# Patient Record
Sex: Male | Born: 2000 | Race: White | Hispanic: No | Marital: Single | Smoking: Current every day smoker
Health system: Southern US, Community
[De-identification: ages and names within clinical notes are randomized; demographics above are authoritative.]

---
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
## 2021-04-02 ENCOUNTER — Emergency Department
Admission: EM | Admit: 2021-04-02 | Discharge: 2021-04-02 | Disposition: A | Discharge status: Home / Self Care | Payer: Medicaid Other | Attending: Emergency Medicine | Admitting: Emergency Medicine

## 2021-04-02 ENCOUNTER — Emergency Department: Payer: Medicaid Other

## 2021-04-02 ENCOUNTER — Other Ambulatory Visit: Payer: Self-pay

## 2021-04-02 DIAGNOSIS — R0602 Shortness of breath: Secondary | ICD-10-CM | POA: Diagnosis not present

## 2021-04-02 DIAGNOSIS — R059 Cough, unspecified: Secondary | ICD-10-CM | POA: Insufficient documentation

## 2021-04-02 DIAGNOSIS — X021XXA Exposure to smoke in controlled fire in building or structure, initial encounter: Secondary | ICD-10-CM | POA: Insufficient documentation

## 2021-04-02 DIAGNOSIS — J705 Respiratory conditions due to smoke inhalation: Secondary | ICD-10-CM | POA: Diagnosis not present

## 2021-04-02 DIAGNOSIS — T59811A Toxic effect of smoke, accidental (unintentional), initial encounter: Secondary | ICD-10-CM | POA: Diagnosis not present

## 2021-04-02 DIAGNOSIS — F17290 Nicotine dependence, other tobacco product, uncomplicated: Secondary | ICD-10-CM | POA: Diagnosis not present

## 2021-04-02 DIAGNOSIS — R0902 Hypoxemia: Secondary | ICD-10-CM | POA: Diagnosis not present

## 2021-04-02 DIAGNOSIS — R0689 Other abnormalities of breathing: Secondary | ICD-10-CM | POA: Diagnosis not present

## 2021-04-02 DIAGNOSIS — T59814A Toxic effect of smoke, undetermined, initial encounter: Secondary | ICD-10-CM | POA: Diagnosis not present

## 2021-04-02 LAB — CBC WITH DIFFERENTIAL/PLATELET
Abs Immature Granulocytes: 0.04 10*3/uL (ref 0.00–0.07)
Basophils Absolute: 0 10*3/uL (ref 0.0–0.1)
Basophils Relative: 0 %
Eosinophils Absolute: 0 10*3/uL (ref 0.0–0.5)
Eosinophils Relative: 0 %
HCT: 39.5 % (ref 39.0–52.0)
Hemoglobin: 14.7 g/dL (ref 13.0–17.0)
Immature Granulocytes: 0 %
Lymphocytes Relative: 13 %
Lymphs Abs: 1.4 10*3/uL (ref 0.7–4.0)
MCH: 33.1 pg (ref 26.0–34.0)
MCHC: 37.2 g/dL — ABNORMAL HIGH (ref 30.0–36.0)
MCV: 89 fL (ref 80.0–100.0)
Monocytes Absolute: 0.8 10*3/uL (ref 0.1–1.0)
Monocytes Relative: 8 %
Neutro Abs: 8.1 10*3/uL — ABNORMAL HIGH (ref 1.7–7.7)
Neutrophils Relative %: 79 %
Platelets: 226 10*3/uL (ref 150–400)
RBC: 4.44 MIL/uL (ref 4.22–5.81)
RDW: 12 % (ref 11.5–15.5)
WBC: 10.4 10*3/uL (ref 4.0–10.5)
nRBC: 0 % (ref 0.0–0.2)

## 2021-04-02 LAB — BASIC METABOLIC PANEL
Anion gap: 9 (ref 5–15)
BUN: 15 mg/dL (ref 6–20)
CO2: 26 mmol/L (ref 22–32)
Calcium: 8.6 mg/dL — ABNORMAL LOW (ref 8.9–10.3)
Chloride: 102 mmol/L (ref 98–111)
Creatinine, Ser: 1.01 mg/dL (ref 0.61–1.24)
GFR, Estimated: >60 mL/min (ref >60)
Glucose, Bld: 99 mg/dL (ref 70–99)
Potassium: 3.7 mmol/L (ref 3.5–5.1)
Sodium: 137 mmol/L (ref 135–145)

## 2021-04-02 LAB — COOXEMETRY PANEL
Carboxyhemoglobin: 2.9 % — ABNORMAL HIGH (ref 0.5–1.5)
Methemoglobin: 0.9 % (ref 0.0–1.5)
O2 Saturation: 86.9 %
Total hemoglobin: 14.7 g/dL (ref 12.0–16.0)

## 2021-04-02 MED ORDER — IPRATROPIUM-ALBUTEROL 0.5-2.5 (3) MG/3ML IN SOLN
3.0000 mL | Freq: Once | RESPIRATORY_TRACT | Status: AC
  Administered 2021-04-02: 3 mL via RESPIRATORY_TRACT
  Filled 2021-04-02: qty 3
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
## 2021-04-02 NOTE — ED Triage Notes (Signed)
Discussed pt with Dr Erma Heritage, pt made acuity level 2 and roomed per Dr Erma Heritage
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
## 2021-04-02 NOTE — Discharge Instructions (Addendum)
Return to the Er if you develop:  Difficulty swallowing  Noise/whistling when breathing  Wheezing  Cough or shortness of breath

## 2021-04-02 NOTE — ED Notes (Signed)
Report received from Heather, RN.

## 2021-04-03 ENCOUNTER — Telehealth: Payer: Self-pay

## 2021-04-03 DIAGNOSIS — Z9189 Other specified personal risk factors, not elsewhere classified: Secondary | ICD-10-CM

## 2021-04-03 NOTE — Telephone Encounter (Signed)
Transition Care Management Unsuccessful Follow-up Telephone Call  Date of discharge and from where:  04/02/2021 from [facility]  Attempts:  1st Attempt  Reason for unsuccessful TCM follow-up call:  Unable to leave message

## 2021-04-04 NOTE — Telephone Encounter (Signed)
Transition Care Management Follow-up Telephone Call Date of discharge and from where: 04/02/2021 from [facility] How have you been since you were released from the hospital? Pt stated that he is feeling much better. Pt stated that his chest does not feel heavy anymore and he is able to take deep breathes. Pt stated that he was interested in establishing care with a PCP. Pt has been informed that his insurance has him listed as a [facility] patient. Pt agreed with referral to assist with [facility] placement.  Any questions or concerns? No  Items Reviewed: Did the pt receive and understand the discharge instructions provided? Yes  Medications obtained and verified? Yes  Other? No  Any new allergies since your discharge? No  Dietary orders reviewed? No Do you have support at home? Yes   Functional Questionnaire: (I = Independent and D = Dependent) ADLs: I  Bathing/Dressing- I  Meal Prep- I  Eating- I  Maintaining continence- I  Transferring/Ambulation- I  Managing Meds- I   Follow up appointments reviewed:  PCP Hospital f/u appt confirmed? No   Specialist Hospital f/u appt confirmed? No   Are transportation arrangements needed? No  If their condition worsens, is the pt aware to call PCP or go to the Emergency Dept.? Yes Was the patient provided with contact information for the PCP's office or ED? Yes Was to pt encouraged to call back with questions or concerns? Yes

## 2021-04-10 ENCOUNTER — Other Ambulatory Visit: Payer: Self-pay

## 2021-04-10 NOTE — Patient Instructions (Signed)
Visit Information  Joel Logan was given information about Medicaid Managed Care team care coordination services as a part of their [facility] Community Plan Medicaid benefit. Joel Logan verbally consented to engagement with the [facility] Managed Care team.   If you are experiencing a medical emergency, please call 911 or report to your local emergency department or urgent care.   If you have a non-emergency medical problem during routine business hours, please contact your provider's office and ask to speak with a nurse.   For questions related to your [facility], please call: [phone] or visit the homepage here: [email or web address]  If you would like to schedule transportation through your [facility], please call the following number at least 2 days in advance of your appointment: [phone].   Call the Behavioral Health Crisis Line at [phone], at any time, 24 hours a day, 7 days a week. If you are in danger or need immediate medical attention call 911.  If you would like help to quit smoking, call 1-800-QUIT-NOW (419-138-2985) OR Espaol: 1-855-Djelo-Ya (0-867-619-5093) o para ms informacin haga clic aqu or Text READY to 267-124 to register via text  Joel Logan - following are the goals we discussed in your visit today:   Goals Addressed   None      The  Patient                                              has been provided with contact information for the Managed Medicaid care management team and has been advised to call with any health related questions or concerns.   Joel Logan, BSW, [facility]  [place]  High Risk Managed Medicaid Team  [phone]   Following is a copy of your plan of care:  There are no care plans to display for this patient.

## 2021-04-10 NOTE — Patient Outreach (Signed)
  Medicaid Managed Care Social Work Note  04/10/2021 Name:  Joel Logan MRN:  [id number] DOB:  30-Sep-2000  Joel Logan is an 20 y.o. year old male who is a primary patient of Patient, No Pcp Per (Inactive).  The [facility] Managed Care Coordination team was consulted for assistance with:   PCP  Mr. Sime was given information about Medicaid Managed Care Coordination team services today. Joel Logan Patient agreed to services and verbal consent obtained.  Engaged with patient  for by telephone forinitial visit in response to referral for case management and/or care coordination services.   Assessments/Interventions:  Review of past medical history, allergies, medications, health status, including review of consultants reports, laboratory and other test data, was performed as part of comprehensive evaluation and provision of chronic care management services.  SDOH: (Social Determinant of Health) assessments and interventions performed: BSW researched medical offices in [place] that are accepting new patients and accepts MM. BSW contacted [facility] and they are accepting new patients as well as [facility]. BSW provided patient with both telephone numbers to call and schedule a new patient appointment. Patient stated no other resources.services are needed at this time.  Advanced Directives Status:  Not addressed in this encounter.  Care Plan                 No Known Allergies  Medications Reviewed Today   Medications were not reviewed prior to this encounter     There are no problems to display for this patient.   Conditions to be addressed/monitored per PCP order:   PCP  There are no care plans that you recently modified to display for this patient.   Follow up:  Patient agrees to Care Plan and Follow-up.  Plan: The  Patient has been provided with contact information for the Managed Medicaid care management team and has been advised to  call with any health related questions or concerns.    Gus Puma, BSW, [facility]  [employer] Risk Managed Medicaid Team  [phone]

## 2021-04-30 ENCOUNTER — Ambulatory Visit (INDEPENDENT_AMBULATORY_CARE_PROVIDER_SITE_OTHER): Payer: Medicaid Other | Admitting: Internal Medicine

## 2021-04-30 ENCOUNTER — Other Ambulatory Visit (HOSPITAL_COMMUNITY)
Admission: RE | Admit: 2021-04-30 | Discharge: 2021-04-30 | Disposition: A | Discharge status: Home / Self Care | Payer: Medicaid Other | Source: Ambulatory Visit | Attending: Internal Medicine | Admitting: Internal Medicine

## 2021-04-30 ENCOUNTER — Other Ambulatory Visit: Payer: Self-pay

## 2021-04-30 ENCOUNTER — Encounter: Payer: Self-pay | Admitting: Internal Medicine

## 2021-04-30 VITALS — BP 114/48 | HR 61 | Temp 97.1°F | Resp 18 | Ht 68.01 in | Wt 151.6 lb

## 2021-04-30 DIAGNOSIS — R300 Dysuria: Secondary | ICD-10-CM | POA: Diagnosis not present

## 2021-04-30 DIAGNOSIS — R1031 Right lower quadrant pain: Secondary | ICD-10-CM

## 2021-04-30 DIAGNOSIS — N50811 Right testicular pain: Secondary | ICD-10-CM

## 2021-04-30 DIAGNOSIS — N50812 Left testicular pain: Secondary | ICD-10-CM | POA: Diagnosis not present

## 2021-04-30 DIAGNOSIS — R350 Frequency of micturition: Secondary | ICD-10-CM | POA: Diagnosis not present

## 2021-04-30 LAB — POCT URINALYSIS DIPSTICK
Bilirubin, UA: NEGATIVE
Blood, UA: NEGATIVE
Glucose, UA: NEGATIVE
Ketones, UA: NEGATIVE
Leukocytes, UA: NEGATIVE
Nitrite, UA: NEGATIVE
Protein, UA: NEGATIVE
Spec Grav, UA: <=1.005 — AB (ref 1.010–1.025)
Urobilinogen, UA: 0.2 E.U./dL
pH, UA: 7 (ref 5.0–8.0)

## 2021-04-30 NOTE — Progress Notes (Signed)
HPI  Pt presents to the clinic today to establish care.  He report RLQ pain. This started 1 week ago. He describes the pain as sharp and achy. He reports some frequency, dysuria and testicular pain. He denies nausea, vomiting, diarrhea, constipation or blood in his stool. He denies urinary urgency, blood in his urine, penile discharge, penile lesion or testicular swelling. He denies fever, chills or body aches. He has not tried anything OTC for these symptoms. He is sexually active. He also does a lot of heavy lifting at work.  No past medical history on file.  No current outpatient medications on file.   No current facility-administered medications for this visit.    No Known Allergies  No family history on file.  Social History   Socioeconomic History   Marital status: Single    Spouse name: Not on file   Number of children: Not on file   Years of education: Not on file   Highest education level: Not on file  Occupational History   Not on file  Tobacco Use   Smoking status: Every Day    Types: E-cigarettes   Smokeless tobacco: Never  Vaping Use   Vaping Use: Every day   Substances: Nicotine, Flavoring  Substance and Sexual Activity   Alcohol use: Not Currently   Drug use: Not Currently    Types: Marijuana   Sexual activity: Yes  Other Topics Concern   Not on file  Social History Narrative   Not on file   Social Determinants of Health   Financial Resource Strain: Not on file  Food Insecurity: Not on file  Transportation Needs: Not on file  Physical Activity: Not on file  Stress: Not on file  Social Connections: Not on file  Intimate Partner Violence: Not on file    ROS:  Constitutional: Denies fever, malaise, fatigue, headache or abrupt weight changes.  HEENT: Denies eye pain, eye redness, ear pain, ringing in the ears, wax buildup, runny nose, nasal congestion, bloody nose, or sore throat. Respiratory: Denies difficulty breathing, shortness of breath,  cough or sputum production.   Cardiovascular: Denies chest pain, chest tightness, palpitations or swelling in the hands or feet.  Gastrointestinal: Pt reports RLQ abdominal pain. Denies bloating, constipation, diarrhea or blood in the stool.  GU: Pt reports frequency, dysuria and testicular pain. Denies urgency, blood in urine, odor or discharge. Musculoskeletal: Denies decrease in range of motion, difficulty with gait, muscle pain or joint pain and swelling.  Skin: Denies redness, rashes, lesions or ulcercations.  Neurological: Denies dizziness, difficulty with memory, difficulty with speech or problems with balance and coordination.  Psych: Denies anxiety, depression, SI/HI.  No other specific complaints in a complete review of systems (except as listed in HPI above).  PE:  BP (!) 114/48 (BP Location: Right Arm, Patient Position: Sitting, Cuff Size: Normal)   Pulse 61   Temp (!) 97.1 F (36.2 C) (Temporal)   Resp 18   Ht 5' 8.01" (1.727 m)   Wt 151 lb 9.6 oz (68.8 kg)   SpO2 100%   BMI 23.04 kg/m  Wt Readings from Last 3 Encounters:  04/30/21 151 lb 9.6 oz (68.8 kg) (45 %, Z= -0.12)*  04/02/21 150 lb (68 kg) (43 %, Z= -0.18)*  07/02/20 160 lb (72.6 kg) (63 %, Z= 0.32)*   * Growth percentiles are based on CDC (Boys, 2-20 Years) data.    General: Appears his stated age, well developed, well nourished in NAD. HEENT: Head: normal shape  and size; Eyes: EOMs intact;  Cardiovascular: Normal rate and rhythm. S1,S2 noted.  No murmur, rubs or gallops noted.  Pulmonary/Chest: Normal effort and positive vesicular breath sounds. No respiratory distress. No wheezes, rales or ronchi noted.  Abdomen: Soft and tender in the RLQ at McBurney's point. + rebound tenderness.  Normal bowel sounds No distention or masses noted.  GU: Normal male anatomy. Fullness noted in the right inguinal area. No obvious hernia bilaterally. Testicles without lesion or swelling.  Musculoskeletal: No difficulty with  gait.  Neurological: Alert and oriented.   BMET    Component Value Date/Time   NA 137 04/02/2021 1814   K 3.7 04/02/2021 1814   CL 102 04/02/2021 1814   CO2 26 04/02/2021 1814   GLUCOSE 99 04/02/2021 1814   BUN 15 04/02/2021 1814   CREATININE 1.01 04/02/2021 1814   CALCIUM 8.6 (L) 04/02/2021 1814   GFRNONAA >60 04/02/2021 1814    Lipid Panel  No results found for: CHOL, TRIG, HDL, CHOLHDL, VLDL, LDLCALC  CBC    Component Value Date/Time   WBC 10.4 04/02/2021 1814   RBC 4.44 04/02/2021 1814   HGB 14.7 04/02/2021 1814   HCT 39.5 04/02/2021 1814   PLT 226 04/02/2021 1814   MCV 89.0 04/02/2021 1814   MCH 33.1 04/02/2021 1814   MCHC 37.2 (H) 04/02/2021 1814   RDW 12.0 04/02/2021 1814   LYMPHSABS 1.4 04/02/2021 1814   MONOABS 0.8 04/02/2021 1814   EOSABS 0.0 04/02/2021 1814   BASOSABS 0.0 04/02/2021 1814    Hgb A1C No results found for: HGBA1C   Assessment and Plan:  RLQ Pain, Urinary Frequency, Dysuria, Testicular Pain:  DDx include UTI, prostatitis, STI, hernia, appendicitis Urinalysis Will check urine for gonorrhea, chlamydia and trich Consider [place] groin to r/o hernia pending labs Push fluids Avoid heaving lifting Likely not appendicitis given no fever, GI symptoms  Will follow up after labs with further recommendations and treatment plan Nicki Reaper, NP This visit occurred during the SARS-CoV-2 public health emergency.  Safety protocols were in place, including screening questions prior to the visit, additional usage of staff PPE, and extensive cleaning of exam room while observing appropriate contact time as indicated for disinfecting solutions.

## 2021-04-30 NOTE — Addendum Note (Signed)
Addended by: Lonna Cobb on: 04/30/2021 02:30 PM   Modules accepted: Orders

## 2021-04-30 NOTE — Patient Instructions (Signed)
Safe Sex Practicing safe sex means taking steps before and during sex to reduce your risk of: Getting an STI (sexually transmitted infection). Giving your partner an STI. Unwanted or unplanned pregnancy. How to practice safe sex Ways you can practice safe sex  Limit your sexual partners to only one partner who is having sex with only you. Avoid using alcohol and drugs before having sex. Alcohol and drugs can affect your judgment. Before having sex with a new partner: Talk to your partner about past partners, past STIs, and drug use. Get screened for STIs and discuss the results with your partner. Ask your partner to get screened too. Check your body regularly for sores, blisters, rashes, or unusual discharge. If you notice any of these problems, visit your health care provider. Avoid sexual contact if you have symptoms of an infection or you are being treated for an STI. While having sex, use a condom. Make sure to: Use a condom every time you have vaginal, oral, or anal sex. Both females and males should wear condoms during oral sex. Keep condoms in place from the beginning to the end of sexual activity. Use a latex condom, if possible. Latex condoms offer the best protection. Use only water-based lubricants with a condom. Using petroleum-based lubricants or oils will weaken the condom and increase the chance that it will break. Ways your health care provider can help you practice safe sex  See your health care provider for regular screenings, exams, and tests for STIs. Talk with your health care provider about what kind of birth control (contraception) is best for you. Get vaccinated against hepatitis B and human papillomavirus (HPV). If you are at risk of being infected with HIV (human immunodeficiency virus), talk with your health care provider about taking a prescription medicine to prevent HIV infection. You are at risk for HIV if you: Are a man who has sex with other men. Are  sexually active with more than one partner. Take drugs by injection. Have a sex partner who has HIV. Have unprotected sex. Have sex with someone who has sex with both men and women. Have had an STI. Follow these instructions at home: Take over-the-counter and prescription medicines only as told by your health care provider. Keep all follow-up visits. This is important. Where to find more information Centers for Disease Control and Prevention: www.cdc.gov Planned Parenthood: www.plannedparenthood.org Office on Women's Health: www.womenshealth.gov Summary Practicing safe sex means taking steps before and during sex to reduce your risk getting an STI, giving your partner an STI, and having an unwanted or unplanned pregnancy. Before having sex with a new partner, talk to your partner about past partners, past STIs, and drug use. Use a condom every time you have vaginal, oral, or anal sex. Both females and males should wear condoms during oral sex. Check your body regularly for sores, blisters, rashes, or unusual discharge. If you notice any of these problems, visit your health care provider. See your health care provider for regular screenings, exams, and tests for STIs. This information is not intended to replace advice given to you by your health care provider. Make sure you discuss any questions you have with your health care provider. Document Revised: 11/14/2019 Document Reviewed: 11/14/2019 Elsevier Patient Education  2022 Elsevier Inc.  

## 2021-04-30 NOTE — Addendum Note (Signed)
Addended by: Lorre Munroe on: 04/30/2021 02:27 PM   Modules accepted: Orders

## 2021-05-02 LAB — URINE CYTOLOGY ANCILLARY ONLY
Chlamydia: NEGATIVE
Comment: NEGATIVE
Comment: NEGATIVE
Comment: NORMAL
Neisseria Gonorrhea: NEGATIVE
Trichomonas: NEGATIVE
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
## 2023-03-10 ENCOUNTER — Ambulatory Visit (INDEPENDENT_AMBULATORY_CARE_PROVIDER_SITE_OTHER): Payer: Medicaid Other | Admitting: Physician Assistant

## 2023-03-10 ENCOUNTER — Ambulatory Visit: Payer: Self-pay | Admitting: *Deleted

## 2023-03-10 ENCOUNTER — Encounter: Payer: Self-pay | Admitting: Physician Assistant

## 2023-03-10 VITALS — BP 139/84 | HR 90 | Wt 151.8 lb

## 2023-03-10 DIAGNOSIS — R300 Dysuria: Secondary | ICD-10-CM | POA: Diagnosis not present

## 2023-03-10 DIAGNOSIS — A749 Chlamydial infection, unspecified: Secondary | ICD-10-CM | POA: Diagnosis not present

## 2023-03-10 DIAGNOSIS — Z113 Encounter for screening for infections with a predominantly sexual mode of transmission: Secondary | ICD-10-CM

## 2023-03-10 LAB — URINALYSIS, ROUTINE W REFLEX MICROSCOPIC
Bilirubin, UA: NEGATIVE
Glucose, UA: NEGATIVE
Ketones, UA: NEGATIVE
Leukocytes,UA: NEGATIVE
Nitrite, UA: NEGATIVE
Protein,UA: NEGATIVE
RBC, UA: NEGATIVE
Specific Gravity, UA: 1.025 (ref 1.005–1.030)
Urobilinogen, Ur: 0.2 mg/dL (ref 0.2–1.0)
pH, UA: 7 (ref 5.0–7.5)
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
## 2023-03-10 NOTE — Telephone Encounter (Signed)
Summary: burning when urinating   Patient called and stated he is having burning when urinating and would like to be STD tested. No appt's available with Nicki Reaper NP or Dr Kirtland Bouchard. No sharpe pains  Last OV: 04/30/2021  Patients callback # [phone]     Reason for Disposition  All other males with painful urination  Answer Assessment - Initial Assessment Questions 1. SEVERITY: "How bad is the pain?"  (e.g., Scale 1-10; mild, moderate, or severe)   - MILD (1-3): Complains slightly about urination hurting.   - MODERATE (4-7): Interferes with normal activities.     - SEVERE (8-10): Excruciating, unwilling or unable to urinate because of the pain.      Moderate/mild 2. FREQUENCY: "How many times have you had painful urination today?"      Not every time- after urination discomfort 3. PATTERN: "Is pain present every time you urinate or just sometimes?"      Not every time 4. ONSET: "When did the painful urination start?"      1 month ago 5. FEVER: "Do you have a fever?" If Yes, ask: "What is your temperature, how was it measured, and when did it start?"     no 6. PAST UTI: "Have you had a urine infection before?" If Yes, ask: "When was the last time?" and "What happened that time?"      no 7. CAUSE: "What do you think is causing the painful urination?"      Not sure 8. OTHER SYMPTOMS: "Do you have any other symptoms?" (e.g., flank pain, penis discharge, scrotal pain, blood in urine)     no  Protocols used: Urination Pain - Male-A-AH

## 2023-03-10 NOTE — Telephone Encounter (Signed)
  Chief Complaint: urinary pain Symptoms: on/off pain with urination Frequency: at least 1 month Pertinent Negatives: Patient denies flank pain, penis discharge, scrotal pain, blood in urine Disposition: [] ED /[] Urgent Care (no appt availability in office) / [x] Appointment(In office/virtual)/ []  [facility]/ [] Home Care/ [] Refused Recommended Disposition /[] [place] [facility]/ []  Follow-up with PCP Additional Notes:  Patient has been having pain with urination for 1 month-  no open appointment with PCP- has been scheduled with float provider.Advised to make appointment with PCP- it has been 2 years

## 2023-03-11 NOTE — Progress Notes (Signed)
Your urine did not show signs of infection or blood  Your HIV testing was negative We are still waiting on the results of your gonorrhea, chlamydia and syphilis testing and will keep you updated once they come back. For now make sure you are staying well hydrated and avoid holding your urine for prolonged periods of time.

## 2023-03-12 LAB — TREPONEMAL ANTIBODIES, TPPA: Treponemal Antibodies, TPPA: NONREACTIVE

## 2023-03-12 LAB — HIV ANTIBODY (ROUTINE TESTING W REFLEX): HIV Screen 4th Generation wRfx: NONREACTIVE

## 2023-03-12 LAB — RPR W/REFLEX TO TREPSURE: RPR: NONREACTIVE

## 2023-03-13 LAB — GC/CHLAMYDIA PROBE AMP
Chlamydia trachomatis, NAA: POSITIVE — AB
Neisseria Gonorrhoeae by PCR: NEGATIVE

## 2023-03-13 MED ORDER — DOXYCYCLINE HYCLATE 100 MG PO TABS: 100.0000 mg | ORAL_TABLET | Freq: Two times a day (BID) | ORAL | 0 refills | Status: AC

## 2023-03-13 NOTE — Addendum Note (Signed)
Addended by: Jacquelin Hawking on: 03/13/2023 11:48 AM   Modules accepted: Orders

## 2023-03-13 NOTE — Progress Notes (Signed)
Joel Logan- please inform the health department of patient's positive result for chlamydia  To patient: Your testing was positive for Chlamydia which will require an antibiotic to manage. I have sent this in for you. Please finish the entire course even if you are feeling better unless you develop an allergic reaction. If this occurs please give the office a call. If you have further symptoms after finishing the antibiotic please make an apt to make sure the infection has cleared up.  Your syphilis testing was negative.

## 2023-10-13 ENCOUNTER — Ambulatory Visit: Payer: Self-pay

## 2023-10-13 NOTE — Telephone Encounter (Signed)
  Chief Complaint: dysuria Symptoms: clear mucus discharge from penis, burning with urination Frequency: x 1 month Pertinent Negatives: Patient denies fever, blood in urine, abdominal pain, lower back pain. Disposition: [] ED /[] Urgent Care (no appt availability in office) / [x] Appointment(In office/virtual)/ []  [facility]/ [] Home Care/ [] Refused Recommended Disposition /[] [place] [facility]/ []  Follow-up with PCP Additional Notes: Patient was seen 02/2023 and tested positive for Chlamydia, he states this feels similar. Patient agreeable to acute visit tomorrow. Offered appointments with PCP but he states he does not get off work until 1pm. Patient scheduled with Dr Romeo Co.  Copied from CRM [id number]. Topic: Clinical - Red Word Triage >> Oct 13, 2023 10:18 AM Rennis Case wrote: Red Word that prompted transfer to Nurse Triage: burning while urinating, requesting STD Testing Reason for Disposition  All other males with painful urination  Answer Assessment - Initial Assessment Questions 1. SEVERITY: "How bad is the pain?"  (e.g., Scale 1-10; mild, moderate, or severe)   - MILD (1-3): Complains slightly about urination hurting.   - MODERATE (4-7): Interferes with normal activities.     - SEVERE (8-10): Excruciating, unwilling or unable to urinate because of the pain.      Mild.  2. FREQUENCY: "How many times have you had painful urination today?"      He states he just woke up and has not urinated yet.  3. PATTERN: "Is pain present every time you urinate or just sometimes?"      First thing in the morning, after drinking more water it doesn't hurt.  4. ONSET: "When did the painful urination start?"      X 1 month.  5. FEVER: "Do you have a fever?" If Yes, ask: "What is your temperature, how was it measured, and when did it start?"     Denies.  6. PAST UTI: "Have you had a urine infection before?" If Yes, ask: "When was the last time?" and "What happened that time?"       Denies.  7. CAUSE: "What do you think is causing the painful urination?"      He states this feels like the chlamydia he had in the past.  8. OTHER SYMPTOMS: "Do you have any other symptoms?" (e.g., flank pain, penis discharge, scrotal pain, blood in urine)     Penis discharge (clear mucus discharge after urinating).  Protocols used: Urination Pain - Male-A-AH

## 2023-10-13 NOTE — Telephone Encounter (Signed)
 Please notify patient that for STD testing in this circumstance we recommend Urine testing for gonorrhea / chlamydia and likely blood test as well.   For the urine, the recommended is to have 1st or 2nd urination of the day. Could you find out if he can possibly come by early AM for a urine test before his appointment? We will not have results back for several days, but we will still treat him for this condition at his appointment in the afternoon.  Let me know if questions or orders if he can do this.  Domingo Friend, DO [facility] 10/13/2023, 10:45 AM

## 2023-10-13 NOTE — Telephone Encounter (Signed)
 Patient advised per Dr. Linnell Richardson. Patient is fine with plan for appointment tomorrow.

## 2023-10-13 NOTE — Telephone Encounter (Signed)
 Understood.  He can keep apt 4/23 320pm with me. I will treat him at that time with antibiotic course regardless of the testing. He can either do urine and lab testing at his apt 320pm, or he can possibly come in Thurs 4/24 early AM for urine testing. If he cannot do the early testing on 4/24, then we will just test him after his apt on 4/23. It is no problem.  Thanks  Domingo Friend, DO [facility] 10/13/2023, 11:22 AM

## 2023-10-14 ENCOUNTER — Encounter: Payer: Self-pay | Admitting: Family Medicine

## 2023-10-14 ENCOUNTER — Other Ambulatory Visit (HOSPITAL_COMMUNITY)
Admission: RE | Admit: 2023-10-14 | Discharge: 2023-10-14 | Disposition: A | Discharge status: Home / Self Care | Source: Ambulatory Visit | Attending: Family Medicine | Admitting: Family Medicine

## 2023-10-14 ENCOUNTER — Ambulatory Visit (INDEPENDENT_AMBULATORY_CARE_PROVIDER_SITE_OTHER): Admitting: Family Medicine

## 2023-10-14 VITALS — BP 112/78 | HR 77 | Ht 68.0 in | Wt 158.2 lb

## 2023-10-14 DIAGNOSIS — Z8619 Personal history of other infectious and parasitic diseases: Secondary | ICD-10-CM | POA: Insufficient documentation

## 2023-10-14 DIAGNOSIS — N342 Other urethritis: Secondary | ICD-10-CM

## 2023-10-14 DIAGNOSIS — Z202 Contact with and (suspected) exposure to infections with a predominantly sexual mode of transmission: Secondary | ICD-10-CM

## 2023-10-14 MED ORDER — DOXYCYCLINE HYCLATE 100 MG PO TABS: 100.0000 mg | ORAL_TABLET | Freq: Two times a day (BID) | ORAL | 0 refills | Status: AC

## 2023-10-14 NOTE — Patient Instructions (Addendum)
 Thank you for coming to the office today.  Urine and blood testing today.  We will cover you for Chlamydia   Start taking Doxycycline  antibiotic 100mg  twice daily for 7 days. Take with full glass of water and stay upright for at least 30 min after taking, may be seated or standing, but should NOT lay down. This is just a safety precaution, if this medicine does not go all the way down throat well it could cause some burning discomfort to throat and esophagus. Also NOTE - do not take medicine within 2 hours (before or after) consuming dairy or foods / vitamins containing high calcium or iron.  Labs testing for HIV Syphilis, Gonorrhea, Chlamydia. And Urine infection  If any issues with antibiotic let us  know  If not 100% resolved after the 7-10 days, then contact us  back and follow up.  If you want to re-test in the future, follow-up when ready.  Please schedule a Follow-up Appointment to: Return if symptoms worsen or fail to improve.  If you have any other questions or concerns, please feel free to call the office or send a message through MyChart. You may also schedule an earlier appointment if necessary.  Additionally, you may be receiving a survey about your experience at our office within a few days to 1 week by e-mail or mail. We value your feedback.  Domingo Friend, DO [facility], [place]

## 2023-10-14 NOTE — Progress Notes (Signed)
 Subjective:    Patient ID: Joel Logan, male    DOB: 2000/08/18, 23 y.o.   MRN: [id number]  Joel Logan is a 23 y.o. male presenting on 10/14/2023 for Dysuria (Over a month, discharge )  Patient presents for a same day appointment.  PCP Helayne Lo, FNP  HPI  Discussed the use of AI scribe software for clinical note transcription with the patient, who gave verbal consent to proceed.  History of Present Illness   Joel Logan is a 23 year old male who presents with burning and mucus discharge, concerned for recurrent chlamydia infection.  He has been experiencing burning and mucus discharge for about a month. He previously had concerns for a sexually transmitted disease, specifically chlamydia, around September of the previous year. He suspects the current symptoms may be a recurrence from a past infection. Symptoms very similar. Last time treated successfully with Doxycycline .  He had a girlfriend during the time of the initial infection, but he has since split up about a month ago. He has not had any new sexual partners since then. He believes the infection may have been passed back and forth between him and his previous partner.  No systemic symptoms such as fevers or chills, noting that the symptoms are localized. The burning sensation occurs every time he urinates, but it is less severe when he is well-hydrated. No increased frequency of urination or urgency.  He was previously treated with doxycycline , 100 mg twice a day for seven days, without any adverse effects such as nausea. The symptoms cleared up completely after the last treatment.  No allergies to antibiotics and no history of gonorrhea, only chlamydia. His previous partner was also treated for chlamydia after his diagnosis.         10/14/2023    3:44 PM 04/30/2021    2:25 PM  Depression screen PHQ 2/9  Decreased Interest 0 0  Down, Depressed, Hopeless 0 0  PHQ - 2 Score 0 0  Altered sleeping 0    Tired, decreased energy 0   Change in appetite 0   Feeling bad or failure about yourself  0   Trouble concentrating 0   Moving slowly or fidgety/restless 0   Suicidal thoughts 0   PHQ-9 Score 0   Difficult doing work/chores Not difficult at all        10/14/2023    3:44 PM  GAD 7 : Generalized Anxiety Score  Nervous, Anxious, on Edge 0  Control/stop worrying 0  Worry too much - different things 0  Trouble relaxing 0  Restless 0  Easily annoyed or irritable 0  Afraid - awful might happen 0  Total GAD 7 Score 0  Anxiety Difficulty Not difficult at all    Social History   Tobacco Use   Smoking status: Every Day    Types: E-cigarettes   Smokeless tobacco: Never  Vaping Use   Vaping status: Every Day   Substances: Nicotine, Flavoring  Substance Use Topics   Alcohol use: Not Currently   Drug use: Not Currently    Types: Marijuana    Review of Systems Per HPI unless specifically indicated above     Objective:    BP 112/78 (BP Location: Left Arm, Patient Position: Sitting, Cuff Size: Normal)   Pulse 77   Ht 5\' 8"  (1.727 m)   Wt 158 lb 3.2 oz (71.8 kg)   SpO2 99%   BMI 24.05 kg/m   Wt Readings from Last 3 Encounters:  10/14/23 158 lb 3.2 oz (71.8 kg)  15-Mar-2023 151 lb 12.8 oz (68.9 kg)  04/30/21 151 lb 9.6 oz (68.8 kg) (45%, Z= -0.12)*   * Growth percentiles are based on CDC (Boys, 2-20 Years) data.    Physical Exam Vitals and nursing note reviewed.  Constitutional:      General: He is not in acute distress.    Appearance: Normal appearance. He is well-developed. He is not diaphoretic.     Comments: Well-appearing, comfortable, cooperative  HENT:     Head: Normocephalic and atraumatic.  Eyes:     General:        Right eye: No discharge.        Left eye: No discharge.     Conjunctiva/sclera: Conjunctivae normal.  Cardiovascular:     Rate and Rhythm: Normal rate.  Pulmonary:     Effort: Pulmonary effort is normal.  Genitourinary:    Comments:  External genital exam declined today Skin:    General: Skin is warm and dry.     Findings: No erythema or rash.  Neurological:     Mental Status: He is alert and oriented to person, place, and time.  Psychiatric:        Mood and Affect: Mood normal.        Behavior: Behavior normal.        Thought Content: Thought content normal.     Comments: Well groomed, good eye contact, normal speech and thoughts     Results for orders placed or performed in visit on 03/15/2023  Urinalysis, Routine w reflex microscopic   Collection Time: 2023-03-15 10:21 AM  Result Value Ref Range   Specific Gravity, UA 1.025 1.005 - 1.030   pH, UA 7.0 5.0 - 7.5   Color, UA Yellow Yellow   Appearance Ur Clear Clear   Leukocytes,UA Negative Negative   Protein,UA Negative Negative/Trace   Glucose, UA Negative Negative   Ketones, UA Negative Negative   RBC, UA Negative Negative   Bilirubin, UA Negative Negative   Urobilinogen, Ur 0.2 0.2 - 1.0 mg/dL   Nitrite, UA Negative Negative   Microscopic Examination Comment   GC/Chlamydia Probe Amp   Collection Time: March 15, 2023 10:45 AM   Specimen: Urine   UR  Result Value Ref Range   Chlamydia trachomatis, NAA Positive (A) Negative   Neisseria Gonorrhoeae by PCR Negative Negative  Treponemal Antibodies, TPPA   Collection Time: 15-Mar-2023 10:47 AM  Result Value Ref Range   Treponemal Antibodies, TPPA Non Reactive Non Reactive   Interpretation: Comment   RPR w/reflex to TrepSure   Collection Time: 03-15-23 10:47 AM  Result Value Ref Range   RPR Non Reactive Non Reactive  HIV Antibody (routine testing w rflx)   Collection Time: 03/15/23 10:47 AM  Result Value Ref Range   HIV Screen 4th Generation wRfx Non Reactive Non Reactive      Assessment & Plan:   Problem List Items Addressed This Visit   None Visit Diagnoses       Urethritis    -  Primary   Relevant Medications   doxycycline  (VIBRA -TABS) 100 MG tablet   Other Relevant Orders   HIV Antibody (routine  testing w rflx)   RPR   GC/Chlamydia probe amp ([place])not at [facility]   Urine Culture     History of chlamydia       Relevant Medications   doxycycline  (VIBRA -TABS) 100 MG tablet   Other Relevant Orders   GC/Chlamydia probe amp ([place])not at  [facility]     STD exposure       Relevant Orders   HIV Antibody (routine testing w rflx)   RPR   GC/Chlamydia probe amp ([place])not at [facility]        Urethritis, male Suspected Chlamydia infection 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 Previous doxycycline  effective 02/2023. Based on past infection and only 1 partner with history of same infection - Low suspicion of gonorrhea.  Possible reinfection from previous partner - last sexual contact 1 month ago and no new partners  - Order urine and blood tests for STD panel including HIV, syphilis, gonorrhea, and chlamydia. - Prescribe doxycycline  100 mg twice daily for 7 days. - Advise abstinence during treatment and for 3 days after, totaling 10 days. - Educate on doxycycline  administration: remain upright for 30 minutes post-ingestion, avoid calcium or iron 2 hours before/after. - Advise follow-up if symptoms persist after 7-10 days or if issues with antibiotic. - If any recurrent would suggest adding Ceftriaxone coverage for gonococcal infection as precaution next time - Offer retesting option after treatment completion and prior to new sexual partner in future       Orders Placed This Encounter  Procedures   Urine Culture   HIV Antibody (routine testing w rflx)   RPR    Meds ordered this encounter  Medications   doxycycline  (VIBRA -TABS) 100 MG tablet    Sig: Take 1 tablet (100 mg total) by mouth 2 (two) times daily. For 10 days. Take with full glass of water, stay upright 30 min after taking.    Dispense:  14 tablet    Refill:  0    Follow up plan: Return if symptoms worsen or fail to improve.  Domingo Friend, DO [facility] 10/14/2023, 3:45 PM

## 2023-10-15 LAB — URINE CULTURE
MICRO NUMBER:: 16366419
Result:: NO GROWTH
SPECIMEN QUALITY:: ADEQUATE

## 2023-10-15 LAB — HIV ANTIBODY (ROUTINE TESTING W REFLEX): HIV 1&2 Ab, 4th Generation: NONREACTIVE

## 2023-10-15 LAB — RPR: RPR Ser Ql: NONREACTIVE

## 2023-10-16 LAB — GC/CHLAMYDIA PROBE AMP (~~LOC~~) NOT AT ARMC
Chlamydia: NEGATIVE
Comment: NEGATIVE
Comment: NORMAL
Neisseria Gonorrhea: NEGATIVE
